# Patient Record
Sex: Male | Born: 1960 | Race: White | Hispanic: No | Marital: Married | Smoking: Never smoker
Health system: Southern US, Community
[De-identification: ages and names within clinical notes are randomized; demographics above are authoritative.]

## PROBLEM LIST (undated history)

## (undated) HISTORY — PX: CHOLECYSTECTOMY: SHX55

## (undated) HISTORY — PX: FINGER SURGERY: SHX640

---

## 2005-07-22 ENCOUNTER — Ambulatory Visit: Payer: Self-pay | Admitting: Unknown Physician Specialty

## 2007-05-10 ENCOUNTER — Emergency Department: Payer: Self-pay | Admitting: Emergency Medicine

## 2007-05-10 ENCOUNTER — Other Ambulatory Visit: Payer: Self-pay

## 2007-05-27 ENCOUNTER — Ambulatory Visit: Payer: Self-pay | Admitting: General Surgery

## 2007-06-03 ENCOUNTER — Ambulatory Visit: Payer: Self-pay | Admitting: General Surgery

## 2007-09-29 ENCOUNTER — Ambulatory Visit: Payer: Self-pay | Admitting: Family Medicine

## 2008-07-13 IMAGING — CT CT ABD-PELV W/ CM
2 of 4 series · 12 of 32 positions shown, 18 images · non-contrast
Comparison: none

REASON FOR EXAM: (1) pancreatitis; (2) pancreatitis
COMMENTS:

[Series 2: abdomen · axial · 0.88mm/px · z∈[-684,-244]mm · 10 of 69 slices shown, 16 images]
[im 7/69  soft-tissue]
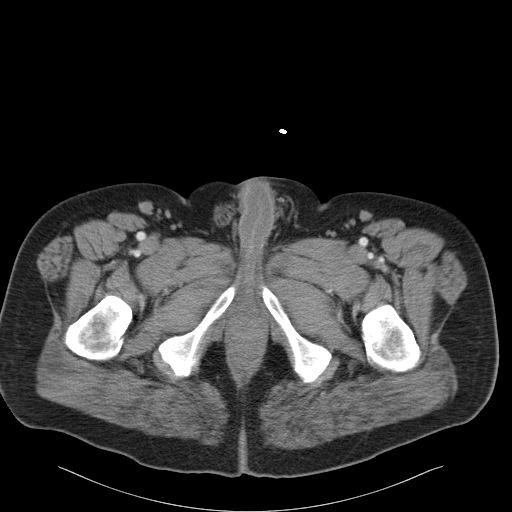
[im 7/69  bone]
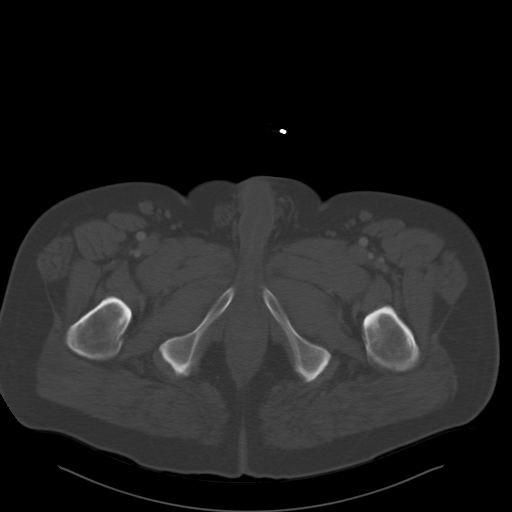
[im 13/69  soft-tissue]
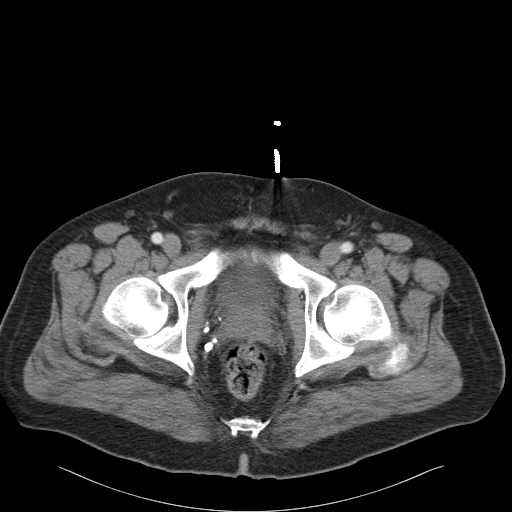
[im 19/69  soft-tissue]
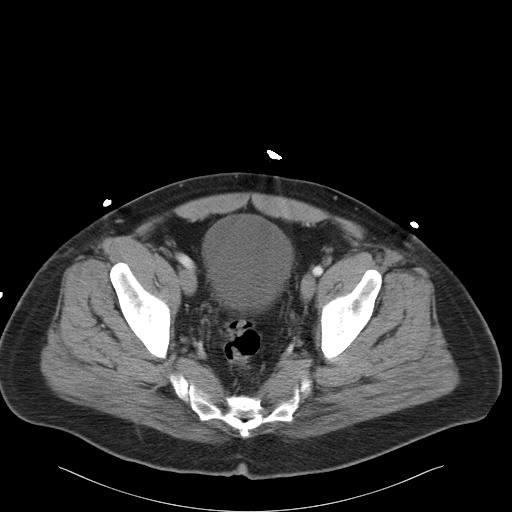
[im 25/69  soft-tissue]
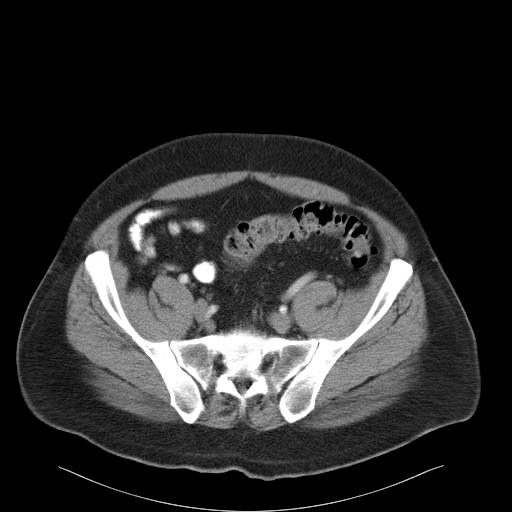
[im 31/69  soft-tissue]
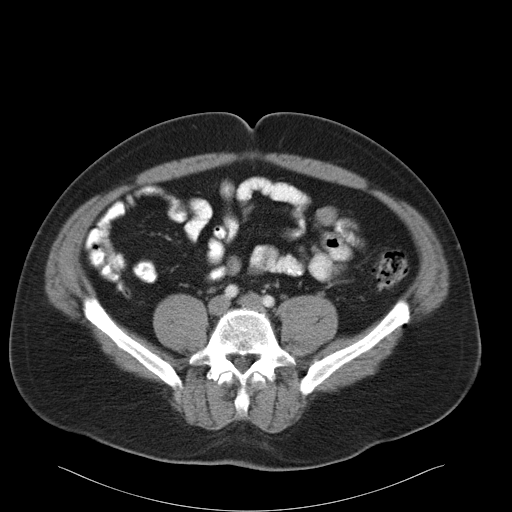
[im 38/69  soft-tissue]
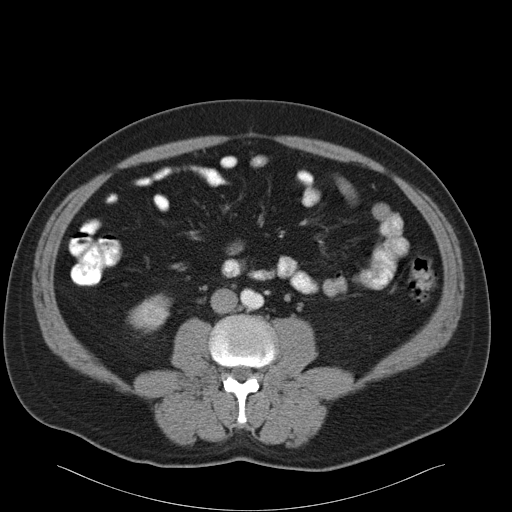
[im 44/69  soft-tissue]
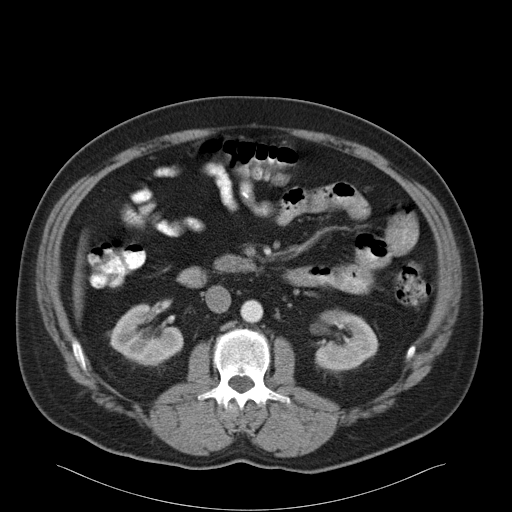
[im 44/69  lung]
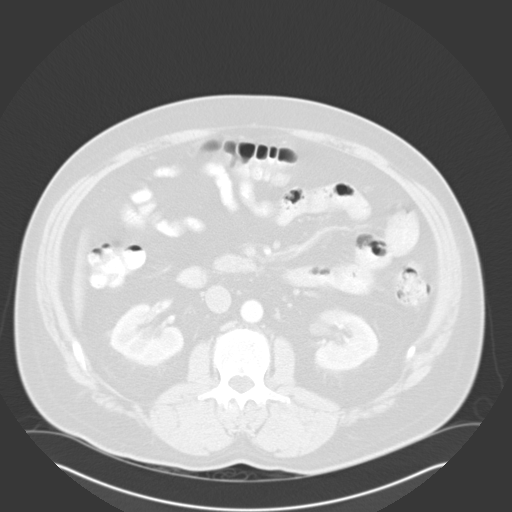
[im 50/69  soft-tissue]
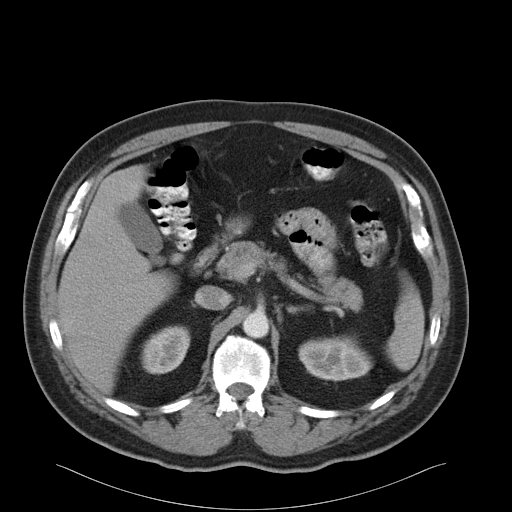
[im 50/69  lung]
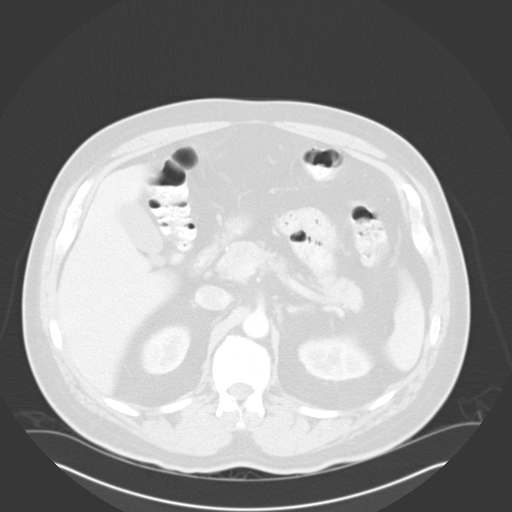
[im 56/69  soft-tissue]
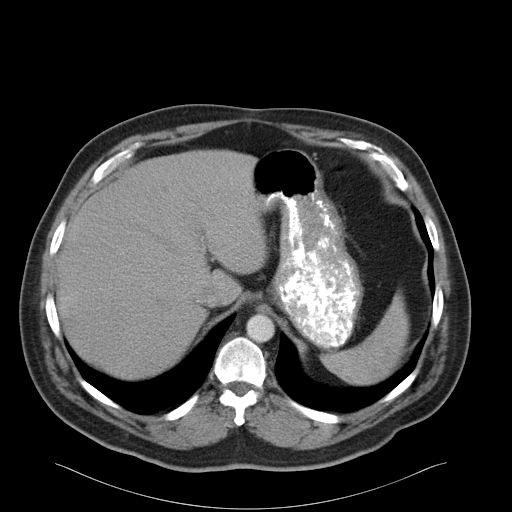
[im 56/69  lung]
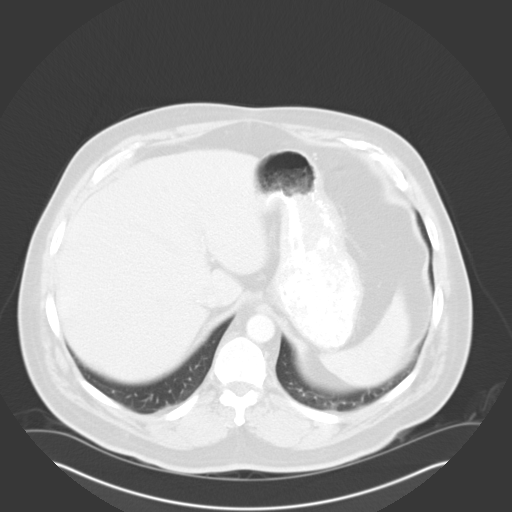
[im 56/69  bone]
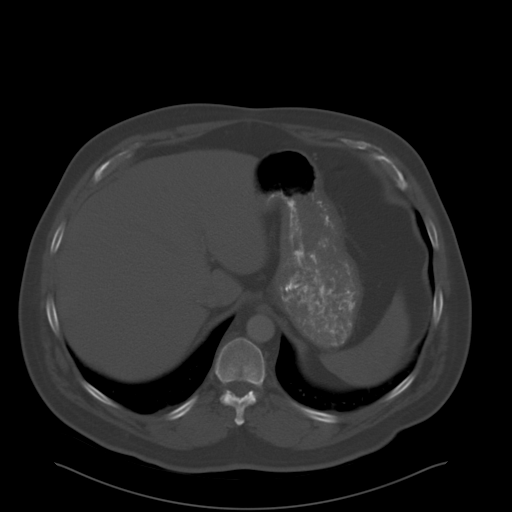
[im 62/69  soft-tissue]
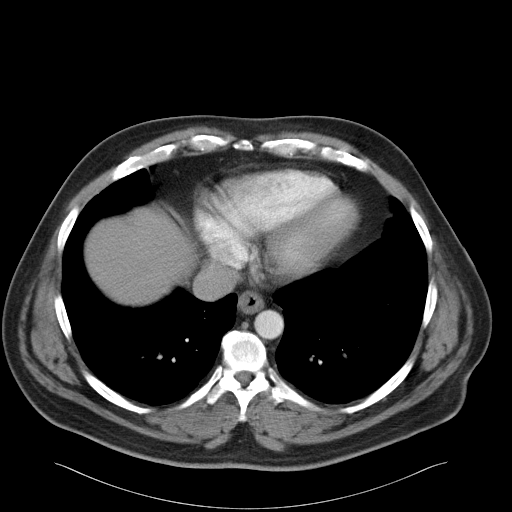
[im 62/69  lung]
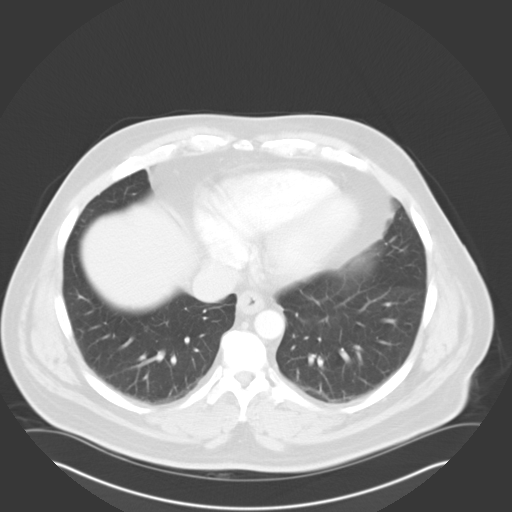

[Series 5: soft tissue delay · axial · delayed · 0.88mm/px · z∈[-292,-244]mm · 2 of 20 slices shown]
[im 7/20  soft-tissue]
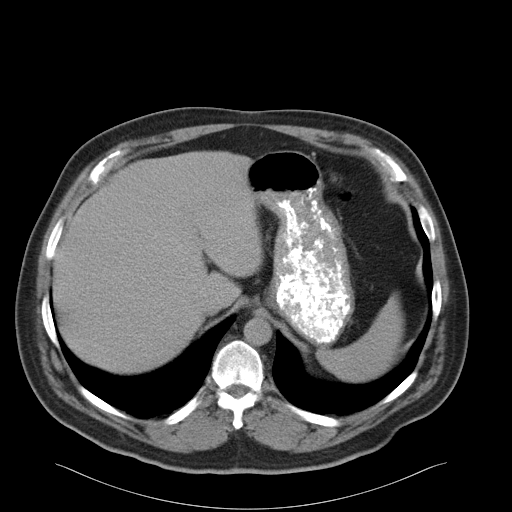
[im 13/20  soft-tissue]
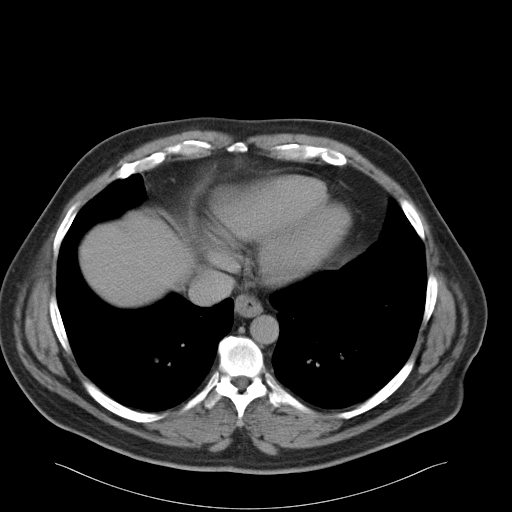

[12 of 32 positions shown; findings below may reference images not displayed]

PROCEDURE:     CT  - CT ABDOMEN / PELVIS  W  - May 10, 2007 [DATE]

RESULT:     CT of the abdomen and pelvis is performed emergently utilizing
100 ml of Xsovue-0FC and oral contrast. A density is present within the
dependent region of the gallbladder consistent with a gallstone. There is a
low density area in the subdiaphragmatic portion of the RIGHT lobe of the
liver measuring approximately 10 mm suggestive of a cyst. There is mild
thickening of the distal esophagus which is nonspecific. Inflammation would
be most likely. Neoplasm cannot be excluded. There is no retroperitoneal or
retrocrural mass or adenopathy. No pelvic mass or adenopathy is evident. The
kidneys appear to enhance normally. There is a duplicated RIGHT renal
collecting system with two ureters evident distally. Whether there is a
single insertion on the RIGHT or two insertions on the RIGHT is uncertain.
Phleboliths are present. A single ureter is evident on the LEFT side. There
are dependent atelectatic changes at the lung bases. The liver, pancreas,
spleen and adrenal glands appear to be unremarkable. There is no evidence of
hydronephrosis or focal renal mass. There is no evidence of appendicitis or
diverticulitis. The urinary bladder opacifies normally on delayed images.
The aorta appears to be normal in caliber.
IMPRESSION: 1. Cholelithiasis.
2. No acute inflammatory change. No evidence of pancreatitis.
3. Incidental note is made of a duplicated collecting system and ureter on
the RIGHT.

## 2008-11-28 ENCOUNTER — Ambulatory Visit: Payer: Self-pay | Admitting: Family Medicine

## 2011-03-15 ENCOUNTER — Emergency Department: Payer: Self-pay | Admitting: Emergency Medicine

## 2012-03-30 ENCOUNTER — Ambulatory Visit: Payer: Self-pay | Admitting: Family Medicine

## 2012-11-01 ENCOUNTER — Ambulatory Visit: Payer: Self-pay | Admitting: Family Medicine

## 2014-02-13 DIAGNOSIS — K219 Gastro-esophageal reflux disease without esophagitis: Secondary | ICD-10-CM | POA: Insufficient documentation

## 2014-02-13 DIAGNOSIS — E782 Mixed hyperlipidemia: Secondary | ICD-10-CM | POA: Insufficient documentation

## 2014-02-16 ENCOUNTER — Ambulatory Visit: Payer: Self-pay | Admitting: Cardiovascular Disease

## 2014-04-05 LAB — PSA: PSA: 0.2

## 2014-04-05 LAB — LIPID PANEL
CHOLESTEROL: 235 mg/dL — AB (ref 0–200)
HDL: 50 mg/dL (ref 35–70)
LDL Cholesterol: 158 mg/dL
LDl/HDL Ratio: 3.2
TRIGLYCERIDES: 137 mg/dL (ref 40–160)

## 2014-04-05 LAB — HEPATIC FUNCTION PANEL
ALK PHOS: 73 U/L (ref 25–125)
ALT: 15 U/L (ref 10–40)
AST: 15 U/L (ref 14–40)
BILIRUBIN, TOTAL: 0.5 mg/dL

## 2014-04-05 LAB — TSH: TSH: 1.31 u[IU]/mL (ref 0.41–5.90)

## 2014-04-05 LAB — BASIC METABOLIC PANEL
BUN: 13 mg/dL (ref 4–21)
Creatinine: 1.1 mg/dL (ref 0.6–1.3)
Glucose: 105 mg/dL
Potassium: 5 mmol/L (ref 3.4–5.3)
Sodium: 141 mmol/L (ref 137–147)

## 2014-04-05 LAB — CBC AND DIFFERENTIAL
HEMATOCRIT: 43 % (ref 41–53)
HEMOGLOBIN: 14.1 g/dL (ref 13.5–17.5)
Neutrophils Absolute: 3 /uL
Platelets: 253 10*3/uL (ref 150–399)
WBC: 5.5 10*3/mL

## 2015-03-15 ENCOUNTER — Other Ambulatory Visit: Payer: Self-pay | Admitting: Family Medicine

## 2015-03-15 DIAGNOSIS — K219 Gastro-esophageal reflux disease without esophagitis: Secondary | ICD-10-CM

## 2015-04-11 ENCOUNTER — Other Ambulatory Visit: Payer: Self-pay

## 2015-04-11 DIAGNOSIS — K219 Gastro-esophageal reflux disease without esophagitis: Secondary | ICD-10-CM

## 2015-04-11 MED ORDER — OMEPRAZOLE 20 MG PO CPDR: 20.0000 mg | DELAYED_RELEASE_CAPSULE | Freq: Two times a day (BID) | ORAL | Status: DC

## 2015-04-14 DIAGNOSIS — IMO0002 Reserved for concepts with insufficient information to code with codable children: Secondary | ICD-10-CM | POA: Insufficient documentation

## 2015-04-14 DIAGNOSIS — G4700 Insomnia, unspecified: Secondary | ICD-10-CM | POA: Insufficient documentation

## 2015-04-14 DIAGNOSIS — K219 Gastro-esophageal reflux disease without esophagitis: Secondary | ICD-10-CM | POA: Insufficient documentation

## 2015-04-14 DIAGNOSIS — F419 Anxiety disorder, unspecified: Secondary | ICD-10-CM | POA: Insufficient documentation

## 2015-04-14 DIAGNOSIS — E669 Obesity, unspecified: Secondary | ICD-10-CM | POA: Insufficient documentation

## 2015-04-14 DIAGNOSIS — M9979 Connective tissue and disc stenosis of intervertebral foramina of abdomen and other regions: Secondary | ICD-10-CM | POA: Insufficient documentation

## 2015-04-14 DIAGNOSIS — K635 Polyp of colon: Secondary | ICD-10-CM | POA: Insufficient documentation

## 2015-04-25 ENCOUNTER — Encounter: Payer: Self-pay | Admitting: Family Medicine

## 2015-04-25 ENCOUNTER — Ambulatory Visit (INDEPENDENT_AMBULATORY_CARE_PROVIDER_SITE_OTHER): Payer: BLUE CROSS/BLUE SHIELD | Admitting: Family Medicine

## 2015-04-25 VITALS — BP 120/72 | HR 62 | Temp 97.6°F | Resp 16 | Wt 289.0 lb

## 2015-04-25 DIAGNOSIS — R059 Cough, unspecified: Secondary | ICD-10-CM

## 2015-04-25 DIAGNOSIS — J069 Acute upper respiratory infection, unspecified: Secondary | ICD-10-CM

## 2015-04-25 DIAGNOSIS — S92912A Unspecified fracture of left toe(s), initial encounter for closed fracture: Secondary | ICD-10-CM | POA: Diagnosis not present

## 2015-04-25 DIAGNOSIS — R05 Cough: Secondary | ICD-10-CM

## 2015-04-25 DIAGNOSIS — R490 Dysphonia: Secondary | ICD-10-CM

## 2015-04-25 MED ORDER — CYCLOBENZAPRINE HCL 10 MG PO TABS: 10.0000 mg | ORAL_TABLET | Freq: Every day | ORAL | Status: DC

## 2015-04-25 NOTE — Progress Notes (Signed)
Patient ID: Brian Scott, male   DOB: 10/23/1960, 54 y.o.   MRN: [id number]    Subjective:  HPI Pt reports that he has had this tickle in his throat (worse at night) that makes him cough. He reports that it has been going on for about 5 days. He has hoarseness as well. He reports that it does not feel like a cold. He reports that it does not feel like drainage. Denies burping more than normal. He reports that his throat feels irritated. He states it feels like fire in his throat when he breaths. Almost like smoking the end of a cigar.   He also complaining of toe pain on his left foot 5th digit. He thinks he broken it and he has taped it to the toe beside it. He just wants to make sure it is healing ok and he does not need to do anything else to it. He is taking Naproxen for it right now as well.   Prior to Admission medications   Medication Sig Start Date End Date Taking? Authorizing Provider  ALPRAZolam Prudy Feeler(XANAX) 0.5 MG tablet Take by mouth. 02/07/14  Yes Historical Provider, MD  naproxen (NAPROSYN) 500 MG tablet Take by mouth. 02/07/14  Yes Historical Provider, MD  Omeprazole 20 MG TBEC Take by mouth. 02/07/14  Yes Historical Provider, MD    Patient Active Problem List   Diagnosis Date Noted  . Anxiety 04/14/2015  . Adult BMI 30+ 04/14/2015  . Colon polyp 04/14/2015  . Narrowing of intervertebral disc space 04/14/2015  . Herniation of nucleus pulposus 04/14/2015  . Gastro-esophageal reflux disease without esophagitis 04/14/2015  . Cannot sleep 04/14/2015  . Adiposity 04/14/2015  . Acid reflux 02/13/2014  . Combined fat and carbohydrate induced hyperlipemia 02/13/2014    History reviewed. No pertinent past medical history.  History   Social History  . Marital Status: Single    Spouse Name: N/A  . Number of Children: N/A  . Years of Education: N/A   Occupational History  . Not on file.   Social History Main Topics  . Smoking status: Never Smoker   . Smokeless tobacco: Not on  file  . Alcohol Use: Yes     Comment: weekends  . Drug Use: No  . Sexual Activity: Not on file   Other Topics Concern  . Not on file   Social History Narrative    Not on File  Review of Systems  Constitutional: Negative.   HENT: Negative.   Eyes: Negative.   Respiratory: Positive for cough.   Cardiovascular: Negative.   Gastrointestinal: Negative.   Genitourinary: Negative.   Musculoskeletal: Positive for joint pain.  Skin: Negative.   Neurological: Negative.   Endo/Heme/Allergies: Negative.   Psychiatric/Behavioral: Negative.     Immunization History  Administered Date(s) Administered  . Tdap 01/31/2009   Objective:  BP 120/72 mmHg  Pulse 62  Temp(Src) 97.6 F (36.4 C) (Oral)  Resp 16  Wt 289 lb (131.09 kg)  SpO2 97%  Physical Exam  Constitutional: He is oriented to person, place, and time and well-developed, well-nourished, and in no distress.  HENT:  Head: Normocephalic and atraumatic.  Right Ear: External ear normal.  Left Ear: External ear normal.  Nose: Nose normal.  Cobblestoning in the back of the throat.  Eyes: Conjunctivae and EOM are normal. Pupils are equal, round, and reactive to light.  Neck: Normal range of motion. Neck supple.  Cardiovascular: Normal rate, regular rhythm, normal heart sounds and intact distal pulses.  Pulmonary/Chest: Effort normal and breath sounds normal.  Neurological: He is alert and oriented to person, place, and time. He has normal reflexes. Gait normal. GCS score is 15.  Skin: Skin is warm and dry.  Psychiatric: Mood, memory, affect and judgment normal.    Lab Results  Component Value Date   WBC 5.5 04/05/2014   HGB 14.1 04/05/2014   HCT 43 04/05/2014   PLT 253 04/05/2014   CHOL 235* 04/05/2014   TRIG 137 04/05/2014   HDL 50 04/05/2014   LDLCALC 158 04/05/2014   TSH 1.31 04/05/2014   PSA 0.2 04/05/2014    CMP     Component Value Date/Time   NA 141 04/05/2014   K 5.0 04/05/2014   BUN 13 04/05/2014    CREATININE 1.1 04/05/2014   AST 15 04/05/2014   ALT 15 04/05/2014   ALKPHOS 73 04/05/2014    Assessment and Plan :  1. Cough No antibiotics indicated at this time. Continue Mucinex and cough lozenges.   2. Hoarseness Viral.  3. Upper respiratory infection If does not improve will call.   4. Toe fracture, left, closed, initial encounter 5th toe with dislocation. Continue Naproxen. Call if not improving may need referral to Ortho. I think this was actually a left fifth toe fracture/dislocation at the PIP joint. It is taped up and splinted today. I'm not sure there will be any benefit to surgical intervention of this time. We'll referred any point in time that the patient likes.  5. Recurrent back spasm. Flexeril 10 mg daily at bedtime when necessary refilled. He is going on a golf trip and it helps.  Patient was seen and examined by Dr. Julieanne Manson, and noted scribed by Dimas Chyle, CMA   Julieanne Manson MD [facility] 04/25/2015 10:35 AM

## 2015-05-04 ENCOUNTER — Ambulatory Visit (INDEPENDENT_AMBULATORY_CARE_PROVIDER_SITE_OTHER): Payer: BLUE CROSS/BLUE SHIELD | Admitting: Family Medicine

## 2015-05-04 ENCOUNTER — Telehealth: Payer: Self-pay | Admitting: Family Medicine

## 2015-05-04 ENCOUNTER — Encounter: Payer: Self-pay | Admitting: Family Medicine

## 2015-05-04 VITALS — BP 112/76 | HR 56 | Temp 97.9°F | Resp 12 | Wt 287.0 lb

## 2015-05-04 DIAGNOSIS — J40 Bronchitis, not specified as acute or chronic: Secondary | ICD-10-CM | POA: Diagnosis not present

## 2015-05-04 MED ORDER — CEFDINIR 300 MG PO CAPS: 300.0000 mg | ORAL_CAPSULE | Freq: Two times a day (BID) | ORAL | Status: DC

## 2015-05-04 NOTE — Telephone Encounter (Signed)
Pt stated he still isn't feeling better and thinks he will need antibiotics to help get him well. Pt saw Dr. Sullivan Lone 04/25/15. I went ahead and scheduled pt with Nadine Counts this afternoon @ 2:30. Pt stated if he needs to come in he will but he would like something sent to the pharmacy. Please advise. Thanks TNP

## 2015-05-04 NOTE — Telephone Encounter (Signed)
Pt should be here shortly-aa

## 2015-05-04 NOTE — Telephone Encounter (Signed)
appointment

## 2015-05-04 NOTE — Patient Instructions (Signed)
Continue Mucinex DM. 

## 2015-05-04 NOTE — Progress Notes (Signed)
Subjective:     Patient ID: Brian Scott, male   DOB: September 18, 1961, 54 y.o.   MRN: [id number]  HPI  Chief Complaint  Patient presents with  . Cough    Patient states he was here on July 20th and was daignosed with URI but was not given any antibiotics. He was advised to try Mucinex and OTC remedies. At that time he just had sore throat and is now feels more in the chest, coughing up yellow/green phlegm, some wheezing. Denies fever and chills.   He is now 14 days from onset of sx. Has been taking Mucinex DM which helps him sleep at night. Reports increased sinus congestion with PND over the last two days.   Review of Systems  Constitutional: Negative for fever and chills.  Ears: T.M's intact without inflammation Throat: no tonsillar enlargement or exudate Neck: no cervical adenopathy Lungs: clear     Objective:   Physical Exam  Constitutional: He appears well-developed and well-nourished. No distress.       Assessment:    1. Bronchitis - cefdinir (OMNICEF) 300 MG capsule; Take 1 capsule (300 mg total) by mouth 2 (two) times daily.  Dispense: 20 capsule; Refill: 0     Plan:    Continue Mucinex DM.

## 2015-05-04 NOTE — Telephone Encounter (Signed)
Do you want him to keep appt with [place] or try medication?-aa

## 2015-05-15 ENCOUNTER — Encounter: Payer: Self-pay | Admitting: Family Medicine

## 2015-05-15 ENCOUNTER — Ambulatory Visit (INDEPENDENT_AMBULATORY_CARE_PROVIDER_SITE_OTHER): Payer: BLUE CROSS/BLUE SHIELD | Admitting: Family Medicine

## 2015-05-15 VITALS — BP 110/70 | HR 72 | Temp 97.7°F | Resp 16 | Ht 75.0 in | Wt 286.0 lb

## 2015-05-15 DIAGNOSIS — Z0000 Encounter for general adult medical examination without abnormal findings: Secondary | ICD-10-CM | POA: Diagnosis not present

## 2015-05-15 DIAGNOSIS — Z125 Encounter for screening for malignant neoplasm of prostate: Secondary | ICD-10-CM

## 2015-05-15 MED ORDER — AZITHROMYCIN 250 MG PO TABS: ORAL_TABLET | ORAL | Status: DC

## 2015-05-15 NOTE — Progress Notes (Signed)
Patient ID: Brian Scott, male   DOB: 1961/05/17, 54 y.o.   MRN: [id number]

## 2015-05-15 NOTE — Progress Notes (Signed)
Patient ID: Brian Scott, male   DOB: 09-05-1961, 54 y.o.   MRN: [id number]       Patient: Brian Scott, Male    DOB: 1961/01/12, 54 y.o.   MRN: [id number] Visit Date: 05/15/2015  Today's Provider: Megan Mans, MD   Chief Complaint  Patient presents with  . Annual Exam   Subjective:    Annual physical exam Brian Scott is a 54 y.o. male who presents today for health maintenance and complete physical. He feels well. He reports he is notexercising . He reports he is sleeping well.  ----------------------------------------------------------------- Tdap-01/31/09 EKG- 02/07/14 Colon- 01/01/11 rpt 5 years Endo 07/22/05    Review of Systems  HENT: Positive for congestion.   Respiratory: Positive for cough and wheezing.   Gastrointestinal: Positive for constipation and abdominal distention.  Endocrine: Negative.   Musculoskeletal: Positive for back pain and arthralgias.  Allergic/Immunologic: Negative.   Neurological: Negative.   Hematological: Negative.   Psychiatric/Behavioral: Negative.     Social History He  reports that he has never smoked. He has never used smokeless tobacco. He reports that he drinks alcohol. He reports that he does not use illicit drugs.  Patient Active Problem List   Diagnosis Date Noted  . Anxiety 04/14/2015  . Adult BMI 30+ 04/14/2015  . Colon polyp 04/14/2015  . Narrowing of intervertebral disc space 04/14/2015  . Herniation of nucleus pulposus 04/14/2015  . Gastro-esophageal reflux disease without esophagitis 04/14/2015  . Cannot sleep 04/14/2015  . Adiposity 04/14/2015  . Acid reflux 02/13/2014  . Combined fat and carbohydrate induced hyperlipemia 02/13/2014    Past Surgical History  Procedure Laterality Date  . Cholecystectomy    . Finger surgery      sewn back on.    Family History  Family Status  Relation Status Death Age  . Mother Alive     smoker and bone sarcoma  . Father Deceased 21    Cause of death: sarcoma   . Sister Alive     His family history includes Cancer in his maternal grandfather and paternal grandfather; Diverticulosis in his mother; Rheum arthritis in his sister.    Not on File  Previous Medications   ALPRAZOLAM (XANAX) 0.5 MG TABLET    Take by mouth.   CYCLOBENZAPRINE (FLEXERIL) 10 MG TABLET    Take 1 tablet (10 mg total) by mouth at bedtime.   NAPROXEN (NAPROSYN) 500 MG TABLET    Take by mouth.   OMEPRAZOLE 20 MG TBEC    Take by mouth.    Patient Care Team: Maple Hudson., MD as PCP - General (Family Medicine)     Objective:   Vitals: BP 110/70 mmHg  Pulse 72  Temp(Src) 97.7 F (36.5 C) (Oral)  Resp 16  Ht 6\' 3"  (1.905 m)  Wt 286 lb (129.729 kg)  BMI 35.75 kg/m2   Physical Exam  Constitutional: He is oriented to person, place, and time. He appears well-developed and well-nourished.  HENT:  Head: Normocephalic and atraumatic.  Right Ear: External ear normal.  Left Ear: External ear normal.  Nose: Nose normal.  Mouth/Throat: Oropharynx is clear and moist.  Eyes: Conjunctivae and EOM are normal. Pupils are equal, round, and reactive to light.  Neck: Normal range of motion. Neck supple.  Cardiovascular: Normal rate, regular rhythm, normal heart sounds and intact distal pulses.   Pulmonary/Chest: Effort normal and breath sounds normal.  Abdominal: Soft. Bowel sounds are normal.  Genitourinary: Rectum normal, prostate normal and  penis normal.  Musculoskeletal: Normal range of motion.  Neurological: He is alert and oriented to person, place, and time. He has normal reflexes.  Skin: Skin is warm and dry.  Psychiatric: He has a normal mood and affect. His behavior is normal. Judgment and thought content normal.     Depression Screen No flowsheet data found.    Assessment & Plan:     Routine Health Maintenance and Physical Exam  Exercise Activities and Dietary recommendations Goals    None      Immunization History  Administered Date(s)  Administered  . Tdap 01/31/2009    Health Maintenance  Topic Date Due  . Hepatitis C Screening  02-Dec-1960  . HIV Screening  06/06/1976  . COLONOSCOPY  06/07/2011  . INFLUENZA VACCINE  05/07/2015  . TETANUS/TDAP  02/01/2019      Discussed health benefits of physical activity, and encouraged him to engage in regular exercise appropriate for his age and condition.   URI Zpak written.   I have done the exam and reviewed the above chart and it is accurate to the best of my knowledge.  --------------------------------------------------------------------

## 2016-09-18 ENCOUNTER — Encounter: Payer: Self-pay | Admitting: Family Medicine

## 2016-09-18 ENCOUNTER — Ambulatory Visit (INDEPENDENT_AMBULATORY_CARE_PROVIDER_SITE_OTHER): Payer: 59 | Admitting: Family Medicine

## 2016-09-18 VITALS — BP 122/70 | HR 68 | Temp 97.9°F | Resp 14 | Ht 75.0 in | Wt 269.0 lb

## 2016-09-18 DIAGNOSIS — G8929 Other chronic pain: Secondary | ICD-10-CM

## 2016-09-18 DIAGNOSIS — M7712 Lateral epicondylitis, left elbow: Secondary | ICD-10-CM | POA: Diagnosis not present

## 2016-09-18 DIAGNOSIS — Z0000 Encounter for general adult medical examination without abnormal findings: Secondary | ICD-10-CM | POA: Diagnosis not present

## 2016-09-18 DIAGNOSIS — Z1211 Encounter for screening for malignant neoplasm of colon: Secondary | ICD-10-CM

## 2016-09-18 DIAGNOSIS — Z125 Encounter for screening for malignant neoplasm of prostate: Secondary | ICD-10-CM | POA: Diagnosis not present

## 2016-09-18 DIAGNOSIS — M545 Low back pain: Secondary | ICD-10-CM | POA: Diagnosis not present

## 2016-09-18 LAB — POCT URINALYSIS DIPSTICK
Bilirubin, UA: NEGATIVE
Blood, UA: NEGATIVE
GLUCOSE UA: NEGATIVE
Ketones, UA: NEGATIVE
Leukocytes, UA: NEGATIVE
NITRITE UA: NEGATIVE
Protein, UA: NEGATIVE
Spec Grav, UA: 1.025
UROBILINOGEN UA: 0.2
pH, UA: 6.5

## 2016-09-18 MED ORDER — CYCLOBENZAPRINE HCL 5 MG PO TABS: 5.0000 mg | ORAL_TABLET | Freq: Every day | ORAL | 11 refills | Status: DC

## 2016-09-18 MED ORDER — NAPROXEN 500 MG PO TABS
500.0000 mg | ORAL_TABLET | Freq: Two times a day (BID) | ORAL | 5 refills | Status: DC
Start: 2016-09-18 — End: 2017-08-07

## 2016-09-18 NOTE — Progress Notes (Signed)
Patient: Brian Scott, Male    DOB: 05/09/1961, 55 y.o.   MRN: [id number] Visit Date: 09/18/2016  Today's Provider: Megan Mansichard Gilbert Jr, MD   Chief Complaint  Patient presents with  . Annual Exam  . Arm Pain    left arm, elbow and right below   Subjective:    Annual physical exam Brian AlfReginald T Manzo is a 55 y.o. male who presents today for health maintenance and complete physical. He feels well. He reports is not exercising. He reports he is sleeping well. He is doing well. His mother died earlier this year. His stress level is much less than it has been in a couple of years.  ----------------------------------------------------------------- Colonoscopy- 01/01/11 Diverticulosis, otherwise normal. Repeat 5 years  Immunization History  Administered Date(s) Administered  . Tdap 01/31/2009     Review of Systems  Constitutional: Negative.   HENT: Negative.   Eyes: Negative.   Respiratory: Negative.   Cardiovascular: Negative.   Gastrointestinal: Negative.   Endocrine: Negative.   Genitourinary: Negative.   Musculoskeletal: Positive for arthralgias and back pain.  Skin: Negative.   Allergic/Immunologic: Negative.   Neurological: Negative.   Hematological: Negative.   Psychiatric/Behavioral: Negative.     Social History      He  reports that he has never smoked. He has never used smokeless tobacco. He reports that he drinks alcohol. He reports that he does not use drugs.       Social History   Social History  . Marital status: Married    Spouse name: N/A  . Number of children: N/A  . Years of education: N/A   Social History Main Topics  . Smoking status: Never Smoker  . Smokeless tobacco: Never Used  . Alcohol use Yes     Comment: 10 drinks a week  . Drug use: No  . Sexual activity: Not Asked   Other Topics Concern  . None   Social History Narrative  . None    History reviewed. No pertinent past medical history.   Patient Active Problem List     Diagnosis Date Noted  . Anxiety 04/14/2015  . Adult BMI 30+ 04/14/2015  . Colon polyp 04/14/2015  . Narrowing of intervertebral disc space 04/14/2015  . Herniation of nucleus pulposus 04/14/2015  . Gastro-esophageal reflux disease without esophagitis 04/14/2015  . Cannot sleep 04/14/2015  . Adiposity 04/14/2015  . Acid reflux 02/13/2014  . Combined fat and carbohydrate induced hyperlipemia 02/13/2014    Past Surgical History:  Procedure Laterality Date  . CHOLECYSTECTOMY    . FINGER SURGERY     sewn back on.    Family History        Family Status  Relation Status  . Mother Alive   smoker and bone sarcoma  . Father Deceased at age 55   Cause of death: sarcoma  . Sister Alive  . Maternal Grandfather   . Paternal Grandfather         His family history includes Cancer in his maternal grandfather and paternal grandfather; Diverticulosis in his mother; Rheum arthritis in his sister.     Not on File   Current Outpatient Prescriptions:  .  cyclobenzaprine (FLEXERIL) 10 MG tablet, Take 1 tablet (10 mg total) by mouth at bedtime., Disp: 30 tablet, Rfl: 5 .  ALPRAZolam (XANAX) 0.5 MG tablet, Take by mouth., Disp: , Rfl:  .  azithromycin (ZITHROMAX) 250 MG tablet, Take 2 day 1 and then 1 daily until gone (Patient  not taking: Reported on 09/18/2016), Disp: 6 tablet, Rfl: 0 .  naproxen (NAPROSYN) 500 MG tablet, Take by mouth., Disp: , Rfl:  .  Omeprazole 20 MG TBEC, Take by mouth., Disp: , Rfl:    Patient Care Team: Maple Hudsonichard L Gilbert Jr., MD as PCP - General (Family Medicine)      Objective:   Vitals: BP 122/70 (BP Location: Left Arm, Patient Position: Sitting, Cuff Size: Large)   Pulse 68   Temp 97.9 F (36.6 C) (Oral)   Resp 14   Ht 6\' 3"  (1.905 m)   Wt 269 lb (122 kg)   BMI 33.62 kg/m    Physical Exam  Constitutional: He is oriented to person, place, and time. He appears well-developed and well-nourished.  HENT:  Head: Normocephalic and atraumatic.  Right Ear:  External ear normal.  Left Ear: External ear normal.  Nose: Nose normal.  Mouth/Throat: Oropharynx is clear and moist.  Eyes: Conjunctivae and EOM are normal. Pupils are equal, round, and reactive to light.  Neck: Normal range of motion. Neck supple.  Cardiovascular: Normal rate, regular rhythm, normal heart sounds and intact distal pulses.   Pulmonary/Chest: Effort normal and breath sounds normal.  Abdominal: Soft. Bowel sounds are normal.  Musculoskeletal: Normal range of motion.  Neurological: He is alert and oriented to person, place, and time. He has normal reflexes.  Skin: Skin is warm and dry.  Psychiatric: He has a normal mood and affect. His behavior is normal. Judgment and thought content normal.     Depression Screen PHQ 2/9 Scores 09/18/2016  PHQ - 2 Score 0      Assessment & Plan:     Routine Health Maintenance and Physical Exam  Exercise Activities and Dietary recommendations Goals    None      Immunization History  Administered Date(s) Administered  . Tdap 01/31/2009    Health Maintenance  Topic Date Due  . Hepatitis C Screening  [birth year]  . HIV Screening  06/06/1976  . COLONOSCOPY  06/07/2011  . INFLUENZA VACCINE  09/18/2017 (Originally 05/06/2016)  . TETANUS/TDAP  02/01/2019   1. Annual physical exam  - POCT urinalysis dipstick - CBC with Differential/Platelet - Lipid Panel With LDL/HDL Ratio - TSH - Comprehensive metabolic panel  2. Colon cancer screening   3. Prostate cancer screening  - PSA  4. Lateral epicondylitis of left elbow  - naproxen (NAPROSYN) 500 MG tablet; Take 1 tablet (500 mg total) by mouth 2 (two) times daily with a meal.  Dispense: 60 tablet; Refill: 5  5. Chronic low back pain, unspecified back pain laterality, with sciatica presence unspecified  - cyclobenzaprine (FLEXERIL) 5 MG tablet; Take 1 tablet (5 mg total) by mouth at bedtime.  Dispense: 30 tablet; Refill: 11 6.Obesity Eating habits discussed at length.  Waist sizes 4 units pants today. Goal 36 inch pants size.  Discussed health benefits of physical activity, and encouraged him to engage in regular exercise appropriate for his age and condition.    --------------------------------------------------------------------  I have done the exam and reviewed the above chart and it is accurate to the best of my knowledge. [employer]  technology has been used in this note in any air is in the dictation or transcription are unintentional.   Megan Mansichard Gilbert Jr, MD  [facility]

## 2016-09-20 LAB — LIPID PANEL WITH LDL/HDL RATIO
CHOLESTEROL TOTAL: 190 mg/dL (ref 100–199)
HDL: 54 mg/dL (ref >39)
LDL CALC: 115 mg/dL — AB (ref 0–99)
LDl/HDL Ratio: 2.1 ratio units (ref 0.0–3.6)
TRIGLYCERIDES: 107 mg/dL (ref 0–149)
VLDL Cholesterol Cal: 21 mg/dL (ref 5–40)

## 2016-09-20 LAB — CBC WITH DIFFERENTIAL/PLATELET
BASOS ABS: 0 10*3/uL (ref 0.0–0.2)
BASOS: 1 %
EOS (ABSOLUTE): 0.4 10*3/uL (ref 0.0–0.4)
Eos: 7 %
HEMOGLOBIN: 14.1 g/dL (ref 13.0–17.7)
Hematocrit: 40.3 % (ref 37.5–51.0)
IMMATURE GRANS (ABS): 0 10*3/uL (ref 0.0–0.1)
IMMATURE GRANULOCYTES: 0 %
LYMPHS: 32 %
Lymphocytes Absolute: 1.8 10*3/uL (ref 0.7–3.1)
MCH: 31.5 pg (ref 26.6–33.0)
MCHC: 35 g/dL (ref 31.5–35.7)
MCV: 90 fL (ref 79–97)
MONOCYTES: 7 %
Monocytes Absolute: 0.4 10*3/uL (ref 0.1–0.9)
NEUTROS ABS: 3 10*3/uL (ref 1.4–7.0)
NEUTROS PCT: 53 %
Platelets: 254 10*3/uL (ref 150–379)
RBC: 4.47 x10E6/uL (ref 4.14–5.80)
RDW: 14.8 % (ref 12.3–15.4)
WBC: 5.7 10*3/uL (ref 3.4–10.8)

## 2016-09-20 LAB — TSH: TSH: 1.36 u[IU]/mL (ref 0.450–4.500)

## 2016-09-20 LAB — COMPREHENSIVE METABOLIC PANEL
A/G RATIO: 1.8 (ref 1.2–2.2)
ALBUMIN: 4.2 g/dL (ref 3.5–5.5)
ALT: 15 IU/L (ref 0–44)
AST: 11 IU/L (ref 0–40)
Alkaline Phosphatase: 68 IU/L (ref 39–117)
BILIRUBIN TOTAL: 0.5 mg/dL (ref 0.0–1.2)
BUN / CREAT RATIO: 20 (ref 9–20)
BUN: 19 mg/dL (ref 6–24)
CALCIUM: 9.4 mg/dL (ref 8.7–10.2)
CHLORIDE: 100 mmol/L (ref 96–106)
CO2: 24 mmol/L (ref 18–29)
Creatinine, Ser: 0.95 mg/dL (ref 0.76–1.27)
GFR, EST AFRICAN AMERICAN: 104 mL/min/{1.73_m2} (ref >59)
GFR, EST NON AFRICAN AMERICAN: 90 mL/min/{1.73_m2} (ref >59)
GLOBULIN, TOTAL: 2.3 g/dL (ref 1.5–4.5)
Glucose: 105 mg/dL — ABNORMAL HIGH (ref 65–99)
POTASSIUM: 4.9 mmol/L (ref 3.5–5.2)
Sodium: 142 mmol/L (ref 134–144)
TOTAL PROTEIN: 6.5 g/dL (ref 6.0–8.5)

## 2016-09-20 LAB — PSA: PROSTATE SPECIFIC AG, SERUM: 0.3 ng/mL (ref 0.0–4.0)

## 2016-09-22 ENCOUNTER — Telehealth: Payer: Self-pay

## 2016-09-22 NOTE — Telephone Encounter (Signed)
-----   Message from Maple Hudsonichard L Gilbert Jr., MD sent at 09/22/2016  8:07 AM EST ----- Labs okay. Patient mildly prediabetic. Discussed diet and exercise habits that will help this. Continue the good changes he has started.

## 2016-09-22 NOTE — Telephone Encounter (Signed)
Pt advised-aa 

## 2016-09-22 NOTE — Telephone Encounter (Signed)
Pt returned call. Thanks TNP °

## 2016-09-22 NOTE — Telephone Encounter (Signed)
LMTCB

## 2016-11-20 ENCOUNTER — Encounter: Payer: Self-pay | Admitting: Family Medicine

## 2016-11-20 ENCOUNTER — Ambulatory Visit (INDEPENDENT_AMBULATORY_CARE_PROVIDER_SITE_OTHER): Payer: 59 | Admitting: Family Medicine

## 2016-11-20 VITALS — BP 132/78 | HR 56 | Temp 97.8°F | Resp 16 | Wt 272.0 lb

## 2016-11-20 DIAGNOSIS — J40 Bronchitis, not specified as acute or chronic: Secondary | ICD-10-CM | POA: Diagnosis not present

## 2016-11-20 MED ORDER — CEFDINIR 300 MG PO CAPS: 300.0000 mg | ORAL_CAPSULE | Freq: Two times a day (BID) | ORAL | 0 refills | Status: DC

## 2016-11-20 NOTE — Patient Instructions (Signed)
Continue Mucinex DM. 

## 2016-11-20 NOTE — Progress Notes (Signed)
Subjective:     Patient ID: Brian Scott, male   DOB: 09/16/1961, 56 y.o.   MRN: [id number]  HPI  Chief Complaint  Patient presents with  . URI    Pt reports he was diagnosed with the flu over an e-visit a week ago. He was prescribed Tamiflu. Pt now believes he has an upper respiratory infection. He is c/o productive cough (yellow/green sputum) and fatigue. Pt also states he is wheezing and has dyspnea. Pt has tried Mucinex, which helps nasal congestion.   Reports that flu like symptoms were improving after onset on 11/09/16. Subsequently on 2/13 developed symptoms above. Reports sinuses have cleared. Using Mucinex DM.   Review of Systems     Objective:   Physical Exam  Constitutional: He appears well-developed and well-nourished. No distress.  Ears: T.M's intact without inflammation Throat: no tonsillar enlargement or exudate Neck: no cervical adenopathy Lungs: clear     Assessment:    1. Bronchitis - cefdinir (OMNICEF) 300 MG capsule; Take 1 capsule (300 mg total) by mouth 2 (two) times daily.  Dispense: 14 capsule; Refill: 0    Plan:    Continue Mucinex DM.

## 2016-11-27 ENCOUNTER — Other Ambulatory Visit: Payer: Self-pay | Admitting: Family Medicine

## 2016-11-27 ENCOUNTER — Telehealth: Payer: Self-pay | Admitting: Family Medicine

## 2016-11-27 DIAGNOSIS — J40 Bronchitis, not specified as acute or chronic: Secondary | ICD-10-CM

## 2016-11-27 MED ORDER — CEFDINIR 300 MG PO CAPS: 300.0000 mg | ORAL_CAPSULE | Freq: Two times a day (BID) | ORAL | 0 refills | Status: DC

## 2016-11-27 NOTE — Telephone Encounter (Signed)
Pt is requesting 3 more days of cefdinir (OMNICEF) 300 MG capsule sent to [facility]. Please advise. Thanks TNP

## 2016-11-27 NOTE — Telephone Encounter (Signed)
Antibiotic sent in

## 2016-11-27 NOTE — Telephone Encounter (Signed)
I called patient to see why he was requesting three more days of antibiotics, he states that he was out of town this week and missed a day of taking medication. Patient reports that he still has shortness of breath and difficulty taking in deep breaths. Patient still has dry cough especially in PM but states that he has been taking otc Mucinex for relief. KW

## 2017-08-07 ENCOUNTER — Encounter: Payer: Self-pay | Admitting: Family Medicine

## 2017-08-07 ENCOUNTER — Ambulatory Visit (INDEPENDENT_AMBULATORY_CARE_PROVIDER_SITE_OTHER): Payer: 59 | Admitting: Family Medicine

## 2017-08-07 VITALS — BP 122/86 | HR 56 | Temp 98.5°F | Resp 16 | Wt 272.8 lb

## 2017-08-07 DIAGNOSIS — J069 Acute upper respiratory infection, unspecified: Secondary | ICD-10-CM | POA: Diagnosis not present

## 2017-08-07 MED ORDER — HYDROCODONE-HOMATROPINE 5-1.5 MG/5ML PO SYRP: ORAL_SOLUTION | ORAL | 0 refills | Status: DC

## 2017-08-07 NOTE — Progress Notes (Signed)
Subjective:     Patient ID: Brian Scott, male   DOB: 12/25/1960, 56 y.o.   MRN: [id number]  HPI  Chief Complaint  Patient presents with  . Cough    Patient comes into office todya with concerns of cough and congestion for the past 5 days. Patient reports associated with cough he has had a sore throat and post nasal drainage.   Has taken some Mucinex for his sx. Reports PND contributing to sore throat.   Review of Systems     Objective:   Physical Exam  Constitutional: He appears well-developed and well-nourished. No distress.  Ears: T.M's intact without inflammation Throat: no tonsillar enlargement or exudate; moderate posterior pharyngeal erythema Neck: no cervical adenopathy Lungs: clear     Assessment:    1. Viral upper respiratory tract infection - HYDROcodone-homatropine (HYCODAN) 5-1.5 MG/5ML syrup; 5 ml 4-6 hours as needed for cough  Dispense: 120 mL; Refill: 0    Plan:    Discussed use of Mucinex D for congestion, Delsym for cough, and Benadryl for postnasal drainage. To call in the next 2-3 days if sinuses not improving.

## 2017-08-07 NOTE — Patient Instructions (Signed)
Discussed use of Mucinex D for congestion, Delsym for cough, and Benadryl for postnasal drainage 

## 2017-08-14 ENCOUNTER — Other Ambulatory Visit: Payer: Self-pay | Admitting: Family Medicine

## 2017-08-14 ENCOUNTER — Telehealth: Payer: Self-pay | Admitting: Family Medicine

## 2017-08-14 MED ORDER — AMOXICILLIN-POT CLAVULANATE 875-125 MG PO TABS: 1.0000 | ORAL_TABLET | Freq: Two times a day (BID) | ORAL | 0 refills | Status: DC

## 2017-08-14 NOTE — Telephone Encounter (Signed)
Was seen in office on 08/07/17 for URI, please review note and advise. KW

## 2017-08-14 NOTE — Telephone Encounter (Signed)
Pt states he saw Nadine CountsBob last week and was told call back if cold moves into chest.  Pt states it has moved into chest and he would like to get an antibiotic.    [facility] [address]

## 2017-08-14 NOTE — Telephone Encounter (Signed)
Patient reports increased sinus pressure, purulent sinus drainage, post nasal drainage and accompanying productive cough @ day #12 of URI. Discussed sending in Augmentin to cover for bacterial complication.

## 2018-11-25 ENCOUNTER — Encounter: Payer: Self-pay | Admitting: Family Medicine

## 2018-11-25 ENCOUNTER — Ambulatory Visit (INDEPENDENT_AMBULATORY_CARE_PROVIDER_SITE_OTHER): Payer: 59 | Admitting: Family Medicine

## 2018-11-25 VITALS — BP 122/72 | HR 60 | Temp 98.1°F | Resp 16 | Wt 277.2 lb

## 2018-11-25 DIAGNOSIS — R1032 Left lower quadrant pain: Secondary | ICD-10-CM

## 2018-11-25 MED ORDER — METRONIDAZOLE 500 MG PO TABS: 500.0000 mg | ORAL_TABLET | Freq: Three times a day (TID) | ORAL | 0 refills | Status: DC

## 2018-11-25 MED ORDER — CIPROFLOXACIN HCL 500 MG PO TABS: 500.0000 mg | ORAL_TABLET | Freq: Two times a day (BID) | ORAL | 0 refills | Status: DC

## 2018-11-25 NOTE — Patient Instructions (Signed)
Discussed putting bowel to rest for 24 hours with clear liquids and soups. If fever, chills or worsening over the next few days to report to the ER. No alcohol while on metronidazole. Consider updating your colonoscopy this year with Dr. Mechele Collin.

## 2018-11-25 NOTE — Progress Notes (Signed)
  Subjective:     Patient ID: Brian Scott, male   DOB: 10-16-1960, 58 y.o.   MRN: [id number] Chief Complaint  Patient presents with  . Abdominal Pain    Pt reports to the office today for left lower abdominal pain x1 week. Pt reports that the pain is constant and dull. Pt denies and nausea or vomiting. Pt reports he has tried OTC antiinflammitory medication with no relief.    HPI  Reports sx onset 2/15. Last formed bowel movement 2-3 days ago. Reports small amount of loose stool yesterday. Last colonoscopy 2012 with sigmoid diverticulosis with repeat recommended in 5 years. States he was treated several years ago for diverticulitis. No dysuria.  Review of Systems     Objective:   Physical Exam Constitutional:      General: He is not in acute distress.    Appearance: He is not ill-appearing.  Abdominal:     General: Bowel sounds are decreased.     Tenderness: There is abdominal tenderness (mild in one specific area of his LLQ). There is no guarding.  Neurological:     Mental Status: He is alert.        Assessment:    1. Left lower quadrant abdominal pain: c/w diverticulitis  - ciprofloxacin (CIPRO) 500 MG tablet; Take 1 tablet (500 mg total) by mouth 2 (two) times daily.  Dispense: 14 tablet; Refill: 0 - metroNIDAZOLE (FLAGYL) 500 MG tablet; Take 1 tablet (500 mg total) by mouth 3 (three) times daily.  Dispense: 21 tablet; Refill: 0    Plan:    Discussed bowel rest for 24 hours. Report to the ER for fever,chills or worsening of sx. Consider colonoscopy this year.

## 2019-01-14 ENCOUNTER — Other Ambulatory Visit: Payer: Self-pay

## 2019-01-14 ENCOUNTER — Encounter: Payer: Self-pay | Admitting: Family Medicine

## 2019-01-14 ENCOUNTER — Ambulatory Visit (INDEPENDENT_AMBULATORY_CARE_PROVIDER_SITE_OTHER): Payer: 59 | Admitting: Family Medicine

## 2019-01-14 ENCOUNTER — Ambulatory Visit: Payer: 59 | Admitting: Family Medicine

## 2019-01-14 VITALS — Temp 98.2°F | Ht 75.0 in | Wt 265.0 lb

## 2019-01-14 DIAGNOSIS — K5732 Diverticulitis of large intestine without perforation or abscess without bleeding: Secondary | ICD-10-CM

## 2019-01-14 DIAGNOSIS — R1032 Left lower quadrant pain: Secondary | ICD-10-CM

## 2019-01-14 MED ORDER — METRONIDAZOLE 500 MG PO TABS: 500.0000 mg | ORAL_TABLET | Freq: Three times a day (TID) | ORAL | 0 refills | Status: DC

## 2019-01-14 MED ORDER — CIPROFLOXACIN HCL 500 MG PO TABS: 500.0000 mg | ORAL_TABLET | Freq: Two times a day (BID) | ORAL | 0 refills | Status: DC

## 2019-01-14 NOTE — Progress Notes (Signed)
Patient: Brian AlfReginald T Hamric Male    DOB: 05/15/1961   58 y.o.   MRN: [id number] Visit Date: 01/14/2019 Virtual Visit via Video Note  I connected with Brian Scott on 01/14/19 at 10:40 AM EDT by a video enabled telemedicine application and verified that I am speaking with the correct person using two identifiers.   I discussed the limitations of evaluation and management by telemedicine and the availability of in person appointments. The patient expressed understanding and agreed to proceed.      I discussed the assessment and treatment plan with the patient. The patient was provided an opportunity to ask questions and all were answered. The patient agreed with the plan and demonstrated an understanding of the instructions.   The patient was advised to call back or seek an in-person evaluation if the symptoms worsen or if the condition fails to improve as anticipated.  I provided 12 minutes of non-face-to-face time during this encounter.   Megan Mansichard Joella Saefong Jr, MD  Today's Provider: Megan Mansichard Bensen Chadderdon Jr, MD   Chief Complaint  Patient presents with  . Abdominal Pain   Subjective:     HPI  Flare up of diverticulitis x 3 days. Patient is experiencing extreme abdominal pain. He is taking Aleve and stool softeners. He has had this before and has not had any complications.Pain is all in LLQ of abdomen. No fever or diarrhea or blood in stool.  No Known Allergies   Current Outpatient Medications:  .  ALPRAZolam (XANAX) 0.5 MG tablet, Take by mouth., Disp: , Rfl:  .  amoxicillin-clavulanate (AUGMENTIN) 875-125 MG tablet, Take 1 tablet 2 (two) times daily by mouth. (Patient not taking: Reported on 01/14/2019), Disp: 20 tablet, Rfl: 0 .  ciprofloxacin (CIPRO) 500 MG tablet, Take 1 tablet (500 mg total) by mouth 2 (two) times daily. (Patient not taking: Reported on 01/14/2019), Disp: 14 tablet, Rfl: 0 .  HYDROcodone-homatropine (HYCODAN) 5-1.5 MG/5ML syrup, 5 ml 4-6 hours as needed for  cough (Patient not taking: Reported on 01/14/2019), Disp: 120 mL, Rfl: 0 .  metroNIDAZOLE (FLAGYL) 500 MG tablet, Take 1 tablet (500 mg total) by mouth 3 (three) times daily. (Patient not taking: Reported on 01/14/2019), Disp: 21 tablet, Rfl: 0  Review of Systems  Constitutional: Negative.   Eyes: Negative.   Respiratory: Negative.   Cardiovascular: Negative.   Gastrointestinal: Positive for abdominal pain. Negative for abdominal distention, blood in stool, diarrhea and nausea.  Genitourinary: Negative.   Skin: Negative.   Neurological: Negative.   Psychiatric/Behavioral: Negative.     Social History   Tobacco Use  . Smoking status: Never Smoker  . Smokeless tobacco: Never Used  Substance Use Topics  . Alcohol use: Yes    Comment: 10 drinks a week      Objective:   Temp 98.2 F (36.8 C) (Oral)   Ht 6\' 3"  (1.905 m)   Wt 265 lb (120.2 kg)   BMI 33.12 kg/m  Vitals:   01/14/19 1024  Temp: 98.2 F (36.8 C)  TempSrc: Oral  Weight: 265 lb (120.2 kg)  Height: 6\' 3"  (1.905 m)     Physical Exam Constitutional:      Appearance: He is well-developed.  HENT:     Head: Normocephalic and atraumatic.  Pulmonary:     Effort: Pulmonary effort is normal.  Abdominal:     General: Abdomen is flat.     Palpations: Abdomen is soft.     Tenderness: There is abdominal tenderness.  Comments: I had pt exam his abdomen and he was tender only in LLQ but not severe.  Neurological:     Mental Status: He is alert.         Assessment & Plan    1. Left lower quadrant abdominal pain  - ciprofloxacin (CIPRO) 500 MG tablet; Take 1 tablet (500 mg total) by mouth 2 (two) times daily.  Dispense: 14 tablet; Refill: 0 - metroNIDAZOLE (FLAGYL) 500 MG tablet; Take 1 tablet (500 mg total) by mouth 3 (three) times daily.  Dispense: 21 tablet; Refill: 0  2. Diverticulitis of large intestine without perforation or abscess without bleeding Pt advised to seek further care if he worsens.      Maui Ahart Wendelyn Breslow, MD  [facility]

## 2020-03-02 ENCOUNTER — Encounter: Payer: Self-pay | Admitting: Family Medicine

## 2020-03-02 ENCOUNTER — Other Ambulatory Visit: Payer: Self-pay

## 2020-03-02 ENCOUNTER — Telehealth (INDEPENDENT_AMBULATORY_CARE_PROVIDER_SITE_OTHER): Payer: 59 | Admitting: Family Medicine

## 2020-03-02 DIAGNOSIS — F5102 Adjustment insomnia: Secondary | ICD-10-CM

## 2020-03-02 DIAGNOSIS — J0100 Acute maxillary sinusitis, unspecified: Secondary | ICD-10-CM

## 2020-03-02 MED ORDER — ALPRAZOLAM 0.5 MG PO TABS: 0.5000 mg | ORAL_TABLET | Freq: Every evening | ORAL | 0 refills | Status: AC | PRN

## 2020-03-02 MED ORDER — AMOXICILLIN-POT CLAVULANATE 875-125 MG PO TABS: 1.0000 | ORAL_TABLET | Freq: Two times a day (BID) | ORAL | 0 refills | Status: DC

## 2020-03-02 NOTE — Progress Notes (Signed)
MyChart Video Visit  I,Bynum Mccullars,acting as a scribe for [employer], [employer].,have documented all relevant documentation on the behalf of [employer],as directed by  [employer], PA while in the presence of [employer], [place].    Virtual Visit via Video Note   This visit type was conducted due to national recommendations for restrictions regarding the COVID-19 Pandemic (e.g. social distancing) in an effort to limit this patient's exposure and mitigate transmission in our community. This patient is at least at moderate risk for complications without adequate follow up. This format is felt to be most appropriate for this patient at this time. Physical exam was limited by quality of the video and audio technology used for the visit.   Patient location: Home Provider location: Office   Patient: Brian Scott   DOB: 1961/09/02   59 y.o. Male  MRN: [id number] Visit Date: 03/02/2020  Today's healthcare provider: Vernie Murders, PA   No chief complaint on file.  Subjective    HPI This 59 year old male has a "cold" 9 days ago that progressed to stuffy nose, sinus pressure, earache, sore throat with PND and slight cough. Initially had rhinorrhea with slight headache. No upper teeth are ache. Never had any fever or loss of taste/smell. Mild improvement in symptoms using Dayquil at onset.   No past medical history on file. Past Surgical History:  Procedure Laterality Date  . CHOLECYSTECTOMY    . FINGER SURGERY     sewn back on.   Social History   Tobacco Use  . Smoking status: Never Smoker  . Smokeless tobacco: Never Used  Substance Use Topics  . Alcohol use: Yes    Comment: 10 drinks a week  . Drug use: No   Family Status  Relation Name Status  . Mother  Alive       smoker and bone sarcoma  . Father  Deceased at age 3       Cause of death: sarcoma  . Sister  Alive  . MGF  (Not Specified)  . PGF  (Not Specified)   No Known Allergies     Medications: Outpatient Medications Prior to Visit  Medication Sig  . ALPRAZolam (XANAX) 0.5 MG tablet Take by mouth.  Marland Kitchen amoxicillin-clavulanate (AUGMENTIN) 875-125 MG tablet Take 1 tablet 2 (two) times daily by mouth. (Patient not taking: Reported on 01/14/2019)  . ciprofloxacin (CIPRO) 500 MG tablet Take 1 tablet (500 mg total) by mouth 2 (two) times daily.  Marland Kitchen HYDROcodone-homatropine (HYCODAN) 5-1.5 MG/5ML syrup 5 ml 4-6 hours as needed for cough (Patient not taking: Reported on 01/14/2019)  . metroNIDAZOLE (FLAGYL) 500 MG tablet Take 1 tablet (500 mg total) by mouth 3 (three) times daily.   No facility-administered medications prior to visit.    Review of Systems  Constitutional: Negative for appetite change, chills and fever.  Respiratory: Negative for chest tightness, shortness of breath and wheezing.   Cardiovascular: Negative for chest pain and palpitations.  Gastrointestinal: Negative for abdominal pain, nausea and vomiting.      Objective    There were no vitals taken for this visit.   Physical Exam: WDWN male in no apparent respiratory distress.  Head: Normocephalic, atraumatic. Neck: Supple, NROM Respiratory: No apparent distress Psych: Normal mood and affect   Assessment & Plan     1. Acute maxillary sinusitis, recurrence not specified Developed rhinorrhea with teeth aching, stuffy head, rhinorrhea and PND the past 9 days. May use Mucinex-DM for symptoms and  add Augmentin for sinusitis. May use antihistamine if having any sneezing/allergy symptoms. Has had both COVID vaccinations approximately 2 months ago. Recheck if any fever or worsening of symptoms after taking the antibiotic. Increase fluid intake and maintain COVID restrictions. - amoxicillin-clavulanate (AUGMENTIN) 875-125 MG tablet; Take 1 tablet by mouth 2 (two) times daily.  Dispense: 20 tablet; Refill: 0  2. Adjustment insomnia Having difficulty getting restful sleep and some slight anxiousness due to  job stress (trying to sell a business). Had used a little Xanax in 2016 for a similar stressful situation and requests a small supply to help with rest. Reminded him to watch for daytime drowsiness and use prn only. - ALPRAZolam (XANAX) 0.5 MG tablet; Take 1 tablet (0.5 mg total) by mouth at bedtime as needed for anxiety.  Dispense: 20 tablet; Refill: 0   No follow-ups on file.     I discussed the assessment and treatment plan with the patient. The patient was provided an opportunity to ask questions and all were answered. The patient agreed with the plan and demonstrated an understanding of the instructions.   The patient was advised to call back or seek an in-person evaluation if the symptoms worsen or if the condition fails to improve as anticipated.  I provided 15 minutes of non-face-to-face time during this encounter.  Haywood Pao, PA, have reviewed all documentation for this visit. The documentation on 03/02/20 for the exam, diagnosis, procedures, and orders are all accurate and complete.   Dortha Kern, PA [facility] [phone] (phone) [phone] (fax)  [facility]

## 2020-07-02 ENCOUNTER — Telehealth: Payer: Self-pay | Admitting: *Deleted

## 2020-07-02 NOTE — Telephone Encounter (Signed)
Pt called stating he tested positive rapid COVID test at [facility] in [place] on 07/01/20; he was tested due to symptoms; tier of symptoms reviewed; instructions given to pt: . patient to remain in self-quarantine until they meet the "Non-Test Criteria for Ending Self-Isolation". Non-Test Criteria for Ending Self-Isolation All persons with fever and respiratory symptoms should isolate themselves until ALL conditions listed below are met: - at least 10 days since symptoms onset - AND 3 consecutive days fever free without antipyretics (acetaminophen [Tylenol] or ibuprofen [Advil]) - AND improvement in respiratory symptoms . If the patient develops respiratory issues/distress, seek medical care in the Emergency Department, call 911, reports symptoms and report COVID-19 positive test. Patient Instructions . continue to utilize over-the-counter medications for fever (ibuprofen and/or Tylenol) and cough (cough medicine and/or sore throat lozenges). . patient to wear a mask around people and follow good infection prevention techniques. . Patient to should only leave home to seek medical care. . send family for food, prescriptions or medicines; or use delivery service.  . If the patient must leave the home, they must wear a mask in public. Marland Kitchen limit contact with immediate family members or caregivers in the home, and use mask, social distancing, and handwashing to decrease risk to patients. o Please continue good preventive care measures, including frequent hand washing, avoid touching your face, cover coughs/sneezes with tissue or into elbow, stay out of crowds and keep a 6-foot distance from others.   . patient and family to clean hard surfaces touched by patient frequently with household cleaning products. The pt was advised to notify his PCP; the pt was also informed the [place] will be notified; he verbalized understanding; will route to provider for notification.

## 2020-07-02 NOTE — Telephone Encounter (Signed)
At 83 would refer to the [facility] infusion center [place]  [place] the case then [place]

## 2020-07-03 ENCOUNTER — Other Ambulatory Visit: Payer: Self-pay | Admitting: Family Medicine

## 2020-07-03 ENCOUNTER — Encounter: Payer: Self-pay | Admitting: Family Medicine

## 2020-07-03 ENCOUNTER — Telehealth (INDEPENDENT_AMBULATORY_CARE_PROVIDER_SITE_OTHER): Payer: 59 | Admitting: Family Medicine

## 2020-07-03 ENCOUNTER — Telehealth: Payer: Self-pay

## 2020-07-03 VITALS — Temp 98.5°F

## 2020-07-03 DIAGNOSIS — R519 Headache, unspecified: Secondary | ICD-10-CM | POA: Diagnosis not present

## 2020-07-03 DIAGNOSIS — U071 COVID-19: Secondary | ICD-10-CM | POA: Diagnosis not present

## 2020-07-03 NOTE — Telephone Encounter (Signed)
Patient was seen today and this has been handled

## 2020-07-03 NOTE — Progress Notes (Signed)
Subjective:    Patient ID: Brian Scott, male    DOB: 27-Oct-1960, 59 y.o.   MRN: [id number]  Virtual Visit via Video Note  I connected with Brian Scott on 07/03/20 at 10:00 AM EDT by a video enabled telemedicine application and verified that I am speaking with the correct person using two identifiers.  Location: Patient: Home in [facility] Provider: [place], [place] Office   I discussed the limitations of evaluation and management by telemedicine and the availability of in person appointments. The patient expressed understanding and agreed to proceed.  History of Present Illness: This 59 year old male developed fever on 07-01-20 of 100.5 with a dull headache, diarrhea and significant fatigue. He had been at a meeting in [place], [place] 06-27-20 to 06-29-20 and has found out 3 attendees have been tested positive for COVID-19. He went to a [employer] and tested positive on 07-02-20. He has not had a cough, significant shortness of breath or loss of his sense of taste or smell. He has used Motrin to try to help his headache and fever (temp today was 98.5).    Observations/Objective: No past medical history on file.  Patient Active Problem List   Diagnosis Date Noted  . Anxiety 04/14/2015  . Adult BMI 30+ 04/14/2015  . Colon polyp 04/14/2015  . Narrowing of intervertebral disc space 04/14/2015  . Herniation of nucleus pulposus 04/14/2015  . Gastro-esophageal reflux disease without esophagitis 04/14/2015  . Cannot sleep 04/14/2015  . Adiposity 04/14/2015  . Acid reflux 02/13/2014  . 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 CHOLECYSTECTOMY    . FINGER SURGERY     sewn back on.   Family History  Problem Relation Age of Onset  . Diverticulosis Mother   . Rheum arthritis Sister   . Cancer Maternal Grandfather   . Cancer Paternal Grandfather    Social History   Tobacco Use  . Smoking status:  Never Smoker  . Smokeless tobacco: Never Used  Substance Use Topics  . Alcohol use: Yes    Comment: 10 drinks a week  . Drug use: No   No Known Allergies    Current Outpatient Medications on File Prior to Visit  Medication Sig Dispense Refill  . ALPRAZolam (XANAX) 0.5 MG tablet Take 1 tablet (0.5 mg total) by mouth at bedtime as needed for anxiety. 20 tablet 0   No current facility-administered medications on file prior to visit.   Review of Systems  Constitutional: Positive for fever and malaise/fatigue.  HENT: Negative for congestion and sore throat.   Eyes: Negative.   Respiratory: Negative for cough, sputum production and shortness of breath.   Cardiovascular: Negative.   Gastrointestinal: Positive for diarrhea. Negative for abdominal pain and nausea.  Musculoskeletal: Negative.   Skin: Negative.   Neurological: Positive for headaches.     Today's Vitals   07/03/20 1528  Temp: 98.5 F (36.9 C)   Wt Readings from Last 3 Encounters:  01/14/19 265 lb (120.2 kg)      BMI 33.12 kg/m  11/25/18 277 lb 3.2 oz (125.7 kg)  08/07/17 272 lb 12.8 oz (123.7 kg)     Physical Exam: WDWN male in no apparent distress.  Head: Normocephalic, atraumatic. Neck: Supple, NROM Respiratory: No apparent distress Psych: Normal mood and affect  Assessment and Plan: 1. Lab test positive for detection of COVID-19 virus Onset of symptoms of fever and headache on  07-01-20 with positive COVID-19 test at a [facility] pharmacy in [place], [place]. Isolating at home and taking Motrin prn headache. No significant cough or loss of taste/smell. Feeling significant fatigue. Will try to contact a facility in [facility] to see if he qualifies for an [facility] infusion treatment.  2. Headache disorder Onset with fever and fatigue with positive COVID-19 test. May continue Motrin prn with increased fluid intake and isolation at his home for 10 days from onset of symptoms and free of fever for 40-72  hours. Maintain COVID restrictions. Should go to an ER if symptoms worsen, dyspnea occurs or fever returns.   Follow Up Instructions:    I discussed the assessment and treatment plan with the patient. The patient was provided an opportunity to ask questions and all were answered. The patient agreed with the plan and demonstrated an understanding of the instructions.   The patient was advised to call back or seek an in-person evaluation if the symptoms worsen or if the condition fails to improve as anticipated.  I provided 23 minutes of non-face-to-face time during this encounter.  Haywood Pao, PA, have reviewed all documentation for this visit. The documentation on 07/03/20 for the exam, diagnosis, procedures, and orders are all accurate and complete.   [employer], PA   Adline Peals, [place]

## 2020-07-05 NOTE — Telephone Encounter (Signed)
Patient referred to infusion clinic at [facility]

## 2021-04-22 ENCOUNTER — Ambulatory Visit: Payer: Self-pay | Admitting: *Deleted

## 2021-04-22 ENCOUNTER — Telehealth: Payer: Self-pay

## 2021-04-22 DIAGNOSIS — U071 COVID-19: Secondary | ICD-10-CM

## 2021-04-22 MED ORDER — MOLNUPIRAVIR EUA 200MG CAPSULE: 4.0000 | ORAL_CAPSULE | Freq: Two times a day (BID) | ORAL | 0 refills | Status: AC

## 2021-04-22 NOTE — Telephone Encounter (Signed)
Rx sent to pharmacy   

## 2021-04-22 NOTE — Telephone Encounter (Signed)
Copied from CRM [id number]. Topic: General - Other >> Apr 22, 2021 11:38 AM Gaetana Michaelis A wrote: Patient has tested positive for COVID 19 via an at home test yesterday 04/21/21  Patient would like to be prescribed a medication to treat their muscle aches, sinus discomfort and temperature changes   Patient has experienced symptoms for roughly 24 hours   Please contact further

## 2021-04-22 NOTE — Addendum Note (Signed)
Addended by: Marlene Lard on: 04/22/2021 05:02 PM   Modules accepted: Orders

## 2021-04-22 NOTE — Telephone Encounter (Signed)
Summary: positive at home covid test   Reason for CRM: Patient tested positive for COVID 19 via an at home test 04/21/21   Patient shares that they're experiencing a headache, as well as body aches and changes in their temperature   Patient shares that they have been experiencing symptoms for roughly 24 hours   Please contact to further advise      Reason for Disposition  [1] COVID-19 diagnosed by positive lab test (e.g., PCR, rapid self-test kit) AND [2] mild symptoms (e.g., cough, fever, others) AND [0] no complications or SOB  Answer Assessment - Initial Assessment Questions 1. COVID-19 DIAGNOSIS: "Who made your COVID-19 diagnosis?" "Was it confirmed by a positive lab test or self-test?" If not diagnosed by a doctor (or NP/PA), ask "Are there lots of cases (community spread) where you live?" Note: See public health department website, if unsure.     + COVID home 2. COVID-19 EXPOSURE: "Was there any known exposure to COVID before the symptoms began?" CDC Definition of close contact: within 6 feet (2 meters) for a total of 15 minutes or more over a 24-hour period.      Not known 3. ONSET: "When did the COVID-19 symptoms start?"      Yesterday evening 4. WORST SYMPTOM: "What is your worst symptom?" (e.g., cough, fever, shortness of breath, muscle aches)     Headache, back ache, congestion 5. COUGH: "Do you have a cough?" If Yes, ask: "How bad is the cough?"       Hard to sleep 6. FEVER: "Do you have a fever?" If Yes, ask: "What is your temperature, how was it measured, and when did it start?"     chills 7. RESPIRATORY STATUS: "Describe your breathing?" (e.g., shortness of breath, wheezing, unable to speak)      Slightly labored- with congestion 8. BETTER-SAME-WORSE: "Are you getting better, staying the same or getting worse compared to yesterday?"  If getting worse, ask, "In what way?"     Same- no change 9. HIGH RISK DISEASE: "Do you have any chronic medical problems?" (e.g., asthma,  heart or lung disease, weak immune system, obesity, etc.)     no 10. VACCINE: "Have you had the COVID-19 vaccine?" If Yes, ask: "Which one, how many shots, when did you get it?"       Moderna- 2 11. BOOSTER: "Have you received your COVID-19 booster?" If Yes, ask: "Which one and when did you get it?"       no 12. PREGNANCY: "Is there any chance you are pregnant?" "When was your last menstrual period?"       N/a 13. OTHER SYMPTOMS: "Do you have any other symptoms?"  (e.g., chills, fatigue, headache, loss of smell or taste, muscle pain, sore throat)       Chill, fatigue, headache, sore throat 14. O2 SATURATION MONITOR:  "Do you use an oxygen saturation monitor (pulse oximeter) at home?" If Yes, ask "What is your reading (oxygen level) today?" "What is your usual oxygen saturation reading?" (e.g., 95%)       Yes- has not checked  Protocols used: Coronavirus (COVID-19) Diagnosed or Suspected-A-AH

## 2021-04-22 NOTE — Telephone Encounter (Signed)
Patient is calling - + COVID home test. Patient states headache, body aches, cough, fever. Patient was diagnosed with COVID 9/21 and did have monoclonal treatment . Patient states this is his second COVID infection . Patient advised per COVID protocol: symptom treatment , isolation and call sent to office for PCP review.
